# Patient Record
Sex: Male | Born: 1995 | Marital: Single | Smoking: Never smoker
Health system: Southern US, Community
[De-identification: ages and names within clinical notes are randomized; demographics above are authoritative.]

## PROBLEM LIST (undated history)

## (undated) DIAGNOSIS — J45909 Unspecified asthma, uncomplicated: Secondary | ICD-10-CM

## (undated) HISTORY — PX: HAND SURGERY: SHX662

## (undated) HISTORY — DX: Unspecified asthma, uncomplicated: J45.909

---

## 2016-11-20 ENCOUNTER — Encounter: Payer: Self-pay | Admitting: Family Medicine

## 2016-11-20 ENCOUNTER — Ambulatory Visit (INDEPENDENT_AMBULATORY_CARE_PROVIDER_SITE_OTHER): Payer: BLUE CROSS/BLUE SHIELD | Admitting: Family Medicine

## 2016-11-20 VITALS — BP 108/76 | HR 60 | Temp 98.4°F | Resp 14 | Ht 71.0 in | Wt 209.0 lb

## 2016-11-20 DIAGNOSIS — M25561 Pain in right knee: Secondary | ICD-10-CM | POA: Diagnosis not present

## 2016-11-20 DIAGNOSIS — Z23 Encounter for immunization: Secondary | ICD-10-CM

## 2016-11-20 DIAGNOSIS — Z0000 Encounter for general adult medical examination without abnormal findings: Secondary | ICD-10-CM

## 2016-11-20 DIAGNOSIS — Z7689 Persons encountering health services in other specified circumstances: Secondary | ICD-10-CM | POA: Diagnosis not present

## 2016-11-20 LAB — CBC WITH DIFFERENTIAL/PLATELET
BASOS ABS: 0 {cells}/uL (ref 0–200)
Basophils Relative: 0 %
EOS ABS: 356 {cells}/uL (ref 15–500)
Eosinophils Relative: 4 %
HCT: 48.5 % (ref 38.5–50.0)
Hemoglobin: 16.8 g/dL (ref 13.0–17.0)
LYMPHS PCT: 31 %
Lymphs Abs: 2759 cells/uL (ref 850–3900)
MCH: 30.3 pg (ref 27.0–33.0)
MCHC: 34.6 g/dL (ref 32.0–36.0)
MCV: 87.5 fL (ref 80.0–100.0)
MONOS PCT: 7 %
MPV: 10.5 fL (ref 7.5–12.5)
Monocytes Absolute: 623 cells/uL (ref 200–950)
Neutro Abs: 5162 cells/uL (ref 1500–7800)
Neutrophils Relative %: 58 %
PLATELETS: 220 10*3/uL (ref 140–400)
RBC: 5.54 MIL/uL (ref 4.20–5.80)
RDW: 13.4 % (ref 11.0–15.0)
WBC: 8.9 10*3/uL (ref 3.8–10.8)

## 2016-11-20 LAB — LIPID PANEL
CHOL/HDL RATIO: 4.2 ratio (ref <5.0)
Cholesterol: 156 mg/dL (ref <200)
HDL: 37 mg/dL — ABNORMAL LOW (ref >40)
LDL Cholesterol: 101 mg/dL — ABNORMAL HIGH (ref <100)
Triglycerides: 88 mg/dL (ref <150)
VLDL: 18 mg/dL (ref <30)

## 2016-11-20 LAB — COMPLETE METABOLIC PANEL WITH GFR
ALT: 22 U/L (ref 9–46)
AST: 18 U/L (ref 10–40)
Albumin: 4.3 g/dL (ref 3.6–5.1)
Alkaline Phosphatase: 81 U/L (ref 40–115)
BILIRUBIN TOTAL: 0.6 mg/dL (ref 0.2–1.2)
BUN: 20 mg/dL (ref 7–25)
CO2: 27 mmol/L (ref 20–31)
CREATININE: 1.02 mg/dL (ref 0.60–1.35)
Calcium: 9.3 mg/dL (ref 8.6–10.3)
Chloride: 106 mmol/L (ref 98–110)
GFR, Est African American: >89 mL/min (ref >=60)
GFR, Est Non African American: >89 mL/min (ref >=60)
GLUCOSE: 93 mg/dL (ref 70–99)
Potassium: 3.9 mmol/L (ref 3.5–5.3)
SODIUM: 139 mmol/L (ref 135–146)
TOTAL PROTEIN: 6.8 g/dL (ref 6.1–8.1)

## 2016-11-20 NOTE — Progress Notes (Signed)
Subjective:    Patient ID: Cameron Tucker, male    DOB: [birth year], 20 y.o.   MRN: [id number]  HPI Patient is a 20 year old white male here today to establish care. He has 2 concerns. #1 he reports a triggering or locking sensation in his right hand at the fifth MCP joint. It occurs with flexion of the MCP joint and PIP joint. It is been ongoing for several months. It is painful and sometimes he has to forcibly extend his finger. He denies any falls or injuries. Exam today is consistent with a trigger finger as he does have locking of the MCP joint with flexion of the MCP and PIP joint. However he is not interested in a cortisone injection or splint at this time he just wanted it evaluated. His second concern is occasional pain in his right knee. This primarily occurs around the patella whenever he gets up from a seated position. He also reports a locking sensation at times. He denies any erythema or effusion. He denies any injury. There is no gross visual deformity to the knee. Past Medical History:  Diagnosis Date  . Asthma   Patient was in the NICU due to meconium aspiration as an infant Past Surgical History:  Procedure Laterality Date  . HAND SURGERY     fracture of right ring finger (remote)   No current outpatient prescriptions on file prior to visit.   No current facility-administered medications on file prior to visit.    No Known Allergies Social History   Social History  . Marital status: Single    Spouse name: N/A  . Number of children: N/A  . Years of education: N/A   Occupational History  . Not on file.   Social History Main Topics  . Smoking status: Never Smoker  . Smokeless tobacco: Never Used  . Alcohol use Yes     Comment: Occasionally  . Drug use: No  . Sexual activity: Not on file   Other Topics Concern  . Not on file   Social History Narrative  . No narrative on file   Family History  Problem Relation Age of Onset  . Cancer Mother     lymphoma  twice  . Diabetes Mother   . Peripheral vascular disease Mother   . Neuropathy Mother   . Multiple sclerosis Father       Review of Systems  All other systems reviewed and are negative.      Objective:   Physical Exam  Constitutional: He is oriented to person, place, and time. He appears well-developed and well-nourished. No distress.  HENT:  Head: Normocephalic and atraumatic.  Right Ear: External ear normal.  Left Ear: External ear normal.  Nose: Nose normal.  Mouth/Throat: Oropharynx is clear and moist. No oropharyngeal exudate.  Eyes: Conjunctivae and EOM are normal. Pupils are equal, round, and reactive to light. Right eye exhibits no discharge. Left eye exhibits no discharge. No scleral icterus.  Neck: Normal range of motion. Neck supple. No JVD present. No tracheal deviation present. No thyromegaly present.  Cardiovascular: Normal rate, regular rhythm, normal heart sounds and intact distal pulses.  Exam reveals no gallop and no friction rub.   No murmur heard. Pulmonary/Chest: Effort normal and breath sounds normal. No stridor. No respiratory distress. He has no wheezes. He has no rales. He exhibits no tenderness.  Abdominal: Soft. Bowel sounds are normal. He exhibits no distension and no mass. There is no tenderness. There is no rebound and no  guarding.  Musculoskeletal: Normal range of motion. He exhibits no edema, tenderness or deformity.  Lymphadenopathy:    He has no cervical adenopathy.  Neurological: He is alert and oriented to person, place, and time. He has normal reflexes. He displays normal reflexes. No cranial nerve deficit. He exhibits normal muscle tone. Coordination normal.  Skin: Skin is warm. No rash noted. He is not diaphoretic. No erythema. No pallor.  Psychiatric: He has a normal mood and affect. His behavior is normal. Judgment and thought content normal.  Vitals reviewed.         Assessment & Plan:  Right knee pain, unspecified chronicity -  Plan: DG Knee Complete 4 Views Right  Routine general medical examination at a health care facility - Plan: COMPLETE METABOLIC PANEL WITH GFR, CBC with Differential/Platelet, Lipid panel  Encounter to establish care with new doctor  I'm not sure of the cause of his knee pain. 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 However I will begin with a basic x-ray of the right knee to evaluate further. We discussed physical therapy treatment strategies for patellofemoral syndrome. Patient elects for no treatment of the trigger finger he has at the right fifth MCP joint. Patient received his flu shot today. He declines a tetanus shot. I will check a CBC, and a CMP along with fasting lipid panel to obtain baseline labs on him given his family history. Otherwise regular anticipatory guidance is provided. Patient declined testicular exam but we did talk about self screening for testicular cancer

## 2016-11-20 NOTE — Addendum Note (Signed)
Addended by: Legrand RamsWILLIS, Milus Fritze B on: 11/20/2016 11:42 AM   Modules accepted: Orders

## 2016-12-24 ENCOUNTER — Ambulatory Visit (HOSPITAL_COMMUNITY)
Admission: RE | Admit: 2016-12-24 | Discharge: 2016-12-24 | Disposition: A | Discharge status: Home / Self Care | Payer: BLUE CROSS/BLUE SHIELD | Source: Ambulatory Visit | Attending: Family Medicine | Admitting: Family Medicine

## 2016-12-24 DIAGNOSIS — M25561 Pain in right knee: Secondary | ICD-10-CM | POA: Diagnosis not present

## 2018-01-12 IMAGING — DX DG KNEE COMPLETE 4+V*R*
4 series · 4 of 4 positions shown · non-contrast
Comparison: None.

CLINICAL DATA: Right knee pain for several years common no acute
injury, initial encounter

EXAM:
RIGHT KNEE - COMPLETE 4+ VIEW

[knee ap]
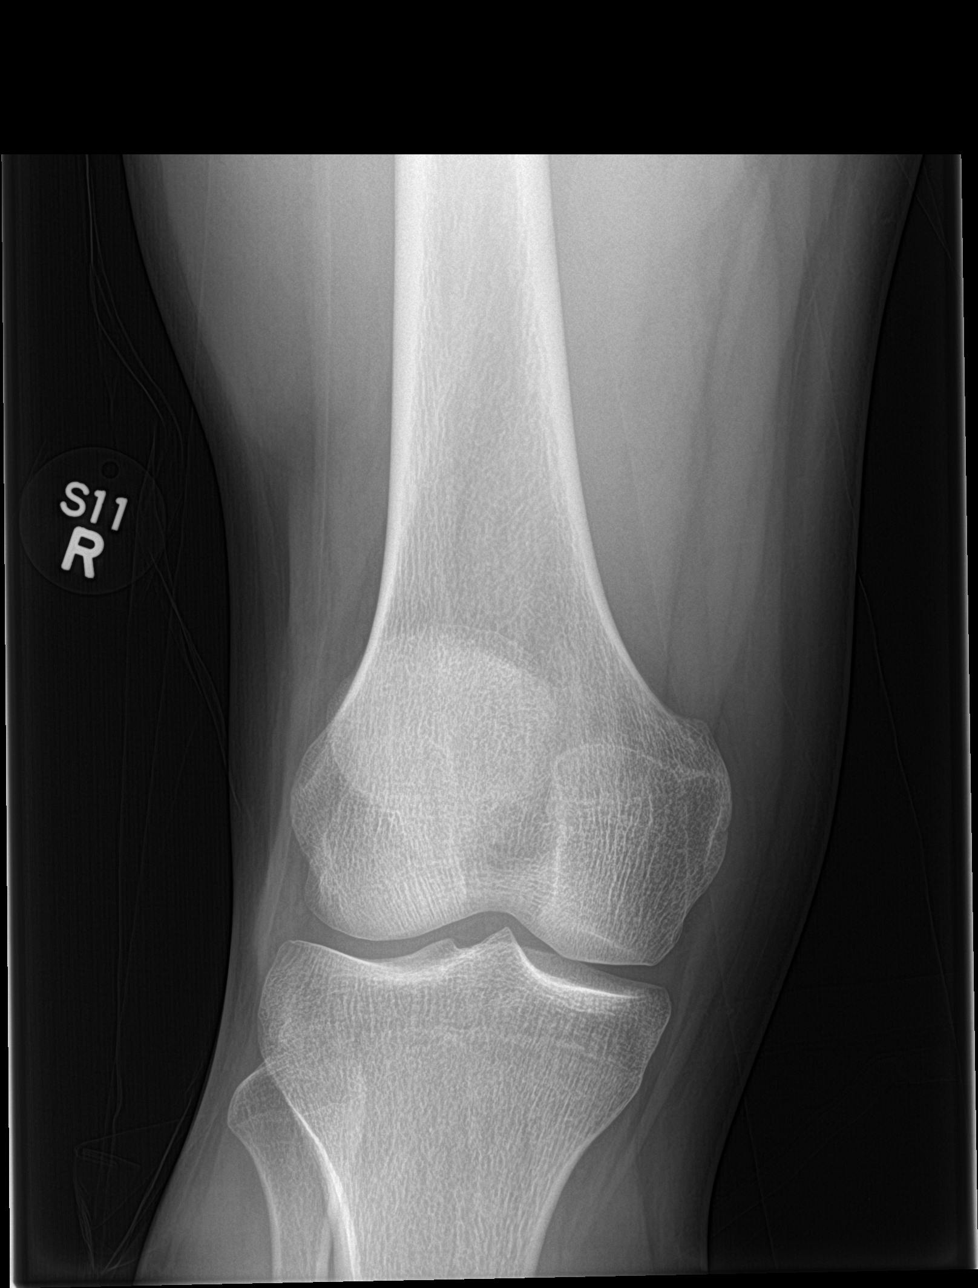

[tunnel]
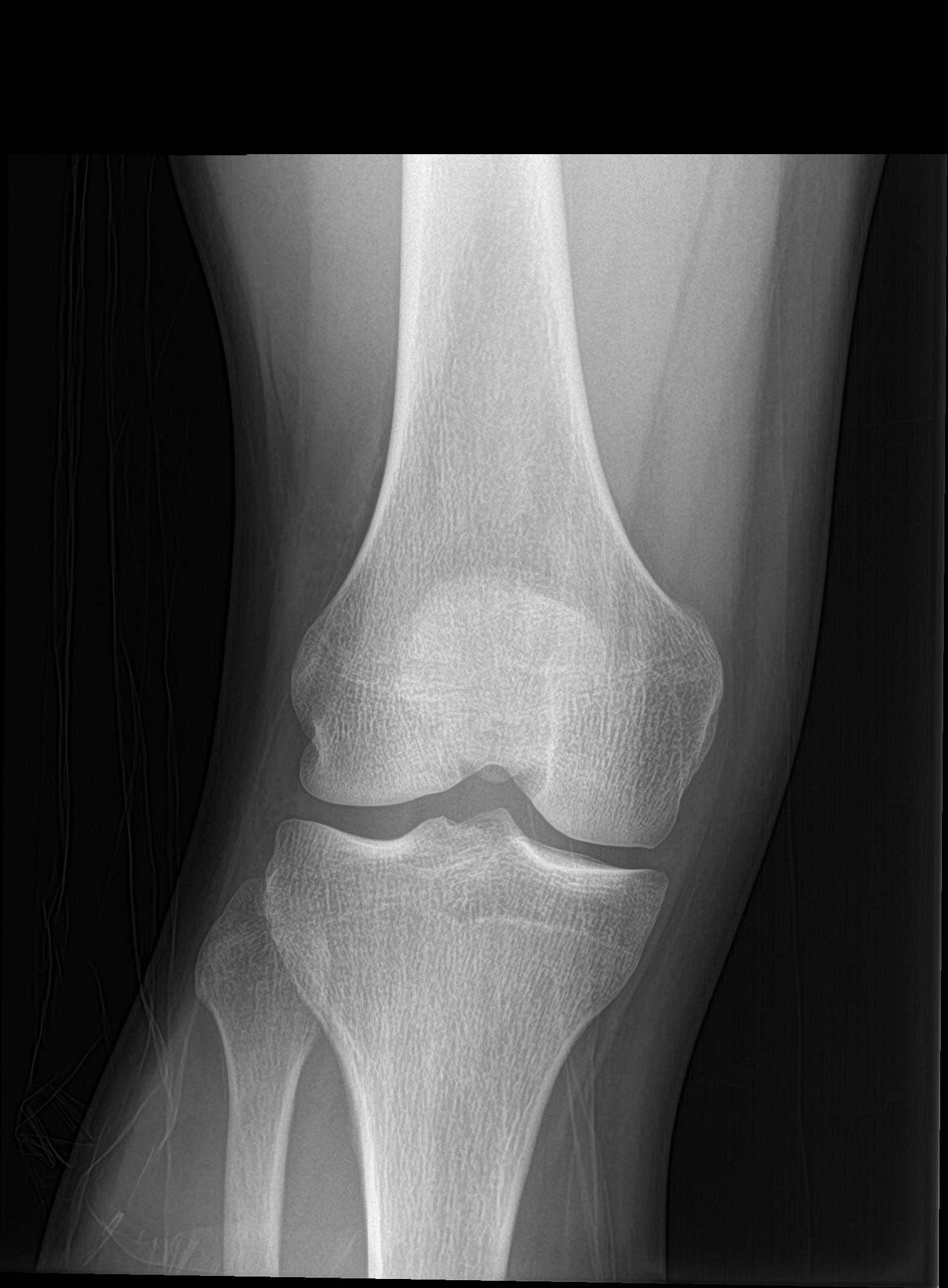

[knee lat]
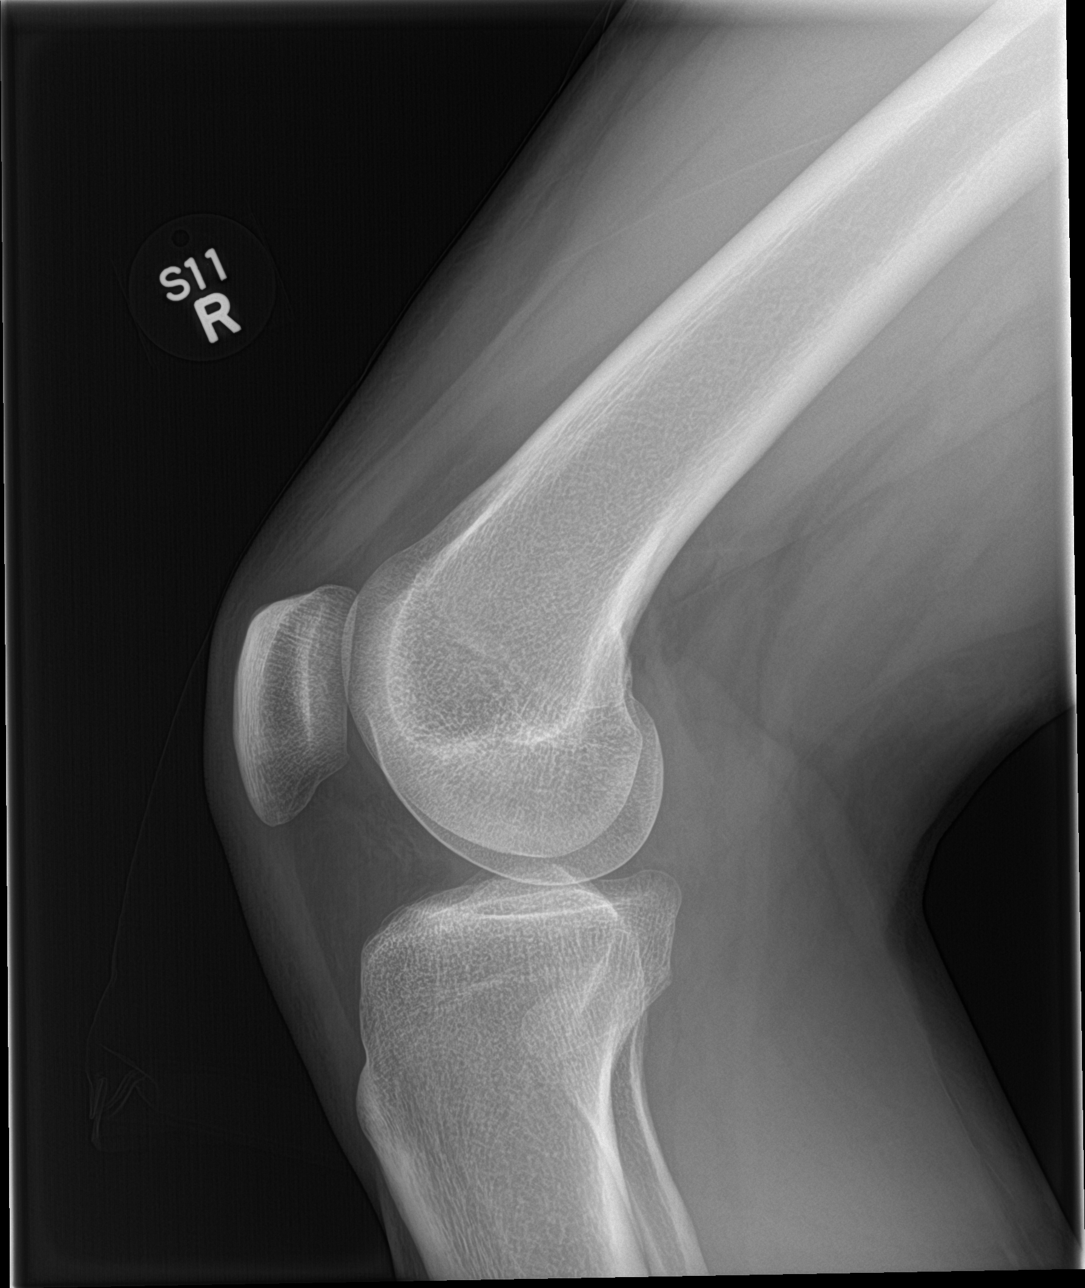

[knee sunrise]
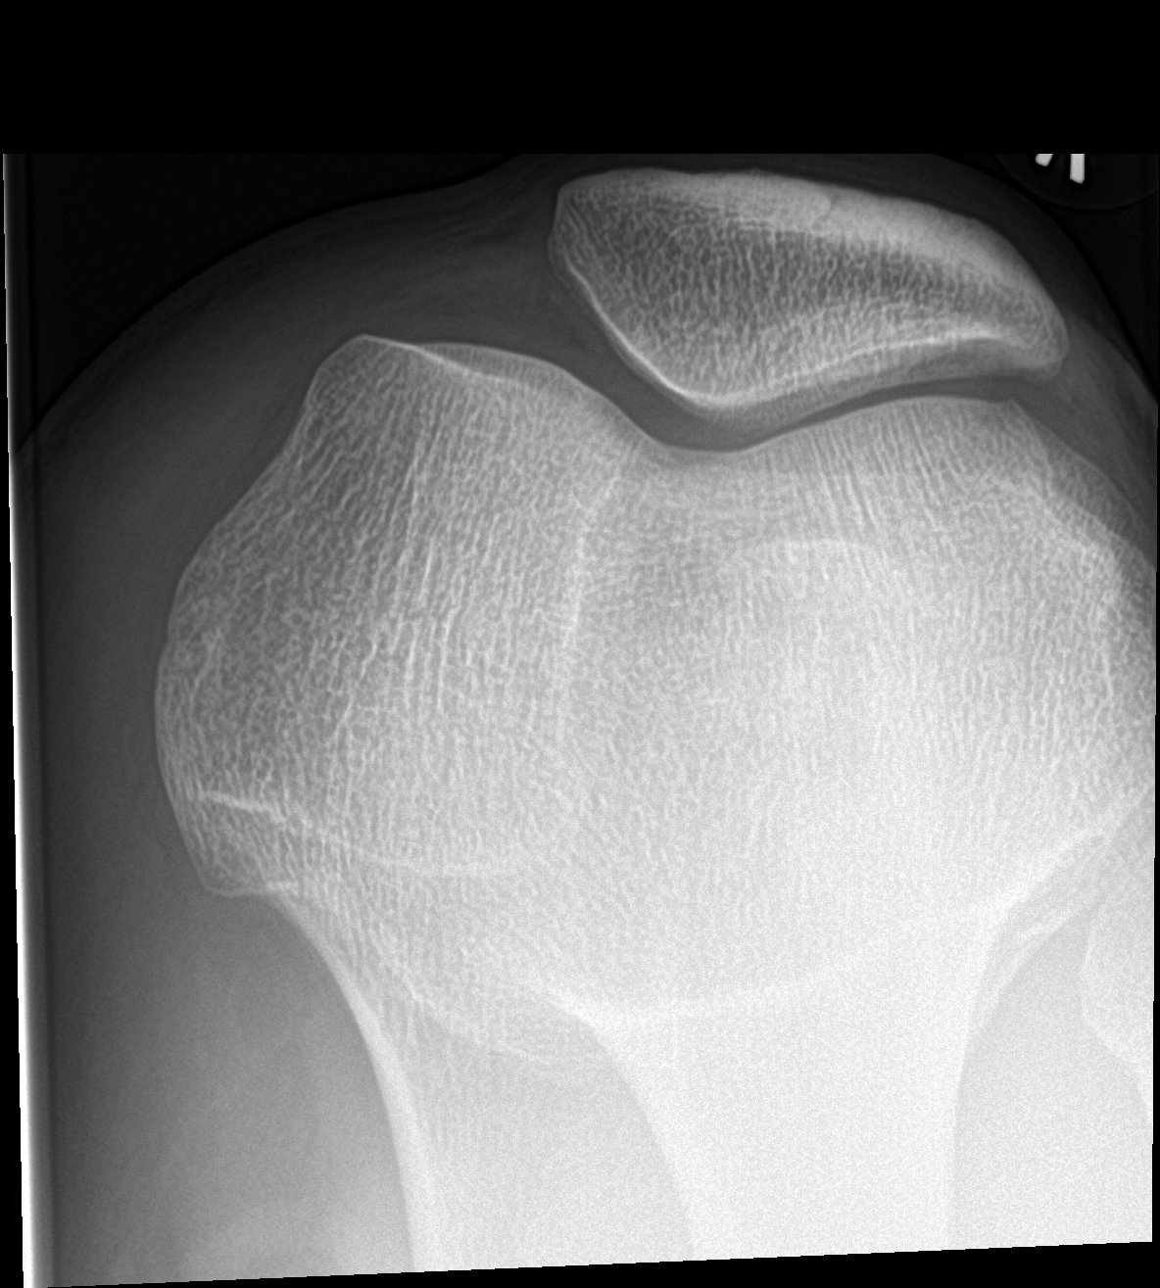

[4 of 4 positions shown; findings below may reference images not displayed]

FINDINGS: No evidence of fracture, dislocation, or joint effusion. No evidence
of arthropathy or other focal bone abnormality. Soft tissues are
unremarkable.
IMPRESSION: No acute abnormality noted.
